# Patient Record
Sex: Male | Born: 1990 | Race: White | Hispanic: No | Marital: Single | Smoking: Current some day smoker
Health system: Southern US, Community
[De-identification: ages and names within clinical notes are randomized; demographics above are authoritative.]

## PROBLEM LIST (undated history)

## (undated) DIAGNOSIS — B019 Varicella without complication: Secondary | ICD-10-CM

## (undated) DIAGNOSIS — R51 Headache: Secondary | ICD-10-CM

## (undated) DIAGNOSIS — M8430XA Stress fracture, unspecified site, initial encounter for fracture: Secondary | ICD-10-CM

## (undated) DIAGNOSIS — K219 Gastro-esophageal reflux disease without esophagitis: Secondary | ICD-10-CM

## (undated) DIAGNOSIS — R519 Headache, unspecified: Secondary | ICD-10-CM

## (undated) DIAGNOSIS — K30 Functional dyspepsia: Secondary | ICD-10-CM

## (undated) HISTORY — DX: Headache, unspecified: R51.9

## (undated) HISTORY — DX: Stress fracture, unspecified site, initial encounter for fracture: M84.30XA

## (undated) HISTORY — DX: Headache: R51

## (undated) HISTORY — DX: Varicella without complication: B01.9

## (undated) HISTORY — DX: Functional dyspepsia: K30

## (undated) HISTORY — DX: Gastro-esophageal reflux disease without esophagitis: K21.9

---

## 2014-07-17 ENCOUNTER — Encounter: Payer: Self-pay | Admitting: Nurse Practitioner

## 2014-07-17 ENCOUNTER — Ambulatory Visit (INDEPENDENT_AMBULATORY_CARE_PROVIDER_SITE_OTHER): Payer: Self-pay | Admitting: Nurse Practitioner

## 2014-07-17 VITALS — BP 127/82 | HR 82 | Temp 97.6°F | Ht 71.0 in | Wt 211.0 lb

## 2014-07-17 DIAGNOSIS — R51 Headache: Secondary | ICD-10-CM

## 2014-07-17 DIAGNOSIS — R519 Headache, unspecified: Secondary | ICD-10-CM | POA: Insufficient documentation

## 2014-07-17 DIAGNOSIS — R5381 Other malaise: Secondary | ICD-10-CM

## 2014-07-17 DIAGNOSIS — R5383 Other fatigue: Secondary | ICD-10-CM | POA: Insufficient documentation

## 2014-07-17 LAB — BASIC METABOLIC PANEL
BUN: 12 mg/dL (ref 6–23)
CALCIUM: 9.7 mg/dL (ref 8.4–10.5)
CO2: 29 meq/L (ref 19–32)
CREATININE: 1 mg/dL (ref 0.4–1.5)
Chloride: 100 mEq/L (ref 96–112)
GFR: 101.22 mL/min (ref >60.00)
GLUCOSE: 83 mg/dL (ref 70–99)
Potassium: 5.1 mEq/L (ref 3.5–5.1)
Sodium: 136 mEq/L (ref 135–145)

## 2014-07-17 LAB — CBC
HCT: 51 % (ref 39.0–52.0)
HEMOGLOBIN: 17 g/dL (ref 13.0–17.0)
MCHC: 33.2 g/dL (ref 30.0–36.0)
MCV: 93.3 fl (ref 78.0–100.0)
Platelets: 229 10*3/uL (ref 150.0–400.0)
RBC: 5.47 Mil/uL (ref 4.22–5.81)
RDW: 13.6 % (ref 11.5–15.5)
WBC: 7.3 10*3/uL (ref 4.0–10.5)

## 2014-07-17 NOTE — Progress Notes (Signed)
Pre visit review using our clinic review tool, if applicable. No additional management support is needed unless otherwise documented below in the visit note. 

## 2014-07-17 NOTE — Patient Instructions (Signed)
I do not think you have lymphoma, but I will get some blood work.  Ears: flush daily with warm water, consider adding hydrogen peroxide to make 1:1 solution. Or use mineral oil twice weekly.  Eat a fresh fruit or vegetable every day to prevent constipation and to get substances like phytochemicals, isoflavonoids, and anti-oxidants that have been shown to decrease risk for heart disease and cancer. Eating 5 or more servings is best for your health. You are young, start now!  We will call with lab results.. Nice to meet you.

## 2014-07-17 NOTE — Progress Notes (Signed)
Subjective:     Arthur Chavez is a 23 y.o. male and is here to establish care. He has had no healthcare in 5 yrs-since military. He works Youth workermanual labor job FT. Reports was released from [employer] [facility] after 1 year due to repeated stress fractures in [place]. Currently, he has no insurance and asks to keep cost at minimum. The patient reports problems - swollen lymph  nodes in neck R >L, fatigue, night sweats, frequent HA since childhood.  Re swollen lymph nodes-he reports intermittent swollen nodes in front of ears R>L, seem to enlarge when he drinks ETOH or eats grapefruit. He is also concerned about sweating at night & feeling tired. He has had these symptoms for about 2 years. A friend told him he may have lymphoma. He denies frequent infections, but reports chronic sinus congestion. He is a smoker & works around [employer]. Typical day starts at 4:30 am, gets home from work at 6 or 7, goes to be at 10, but takes 2 hrs to fall asleep.  Re frequent HA-duration since childhood. Had w/u in past including head CT-thinks was nml. Takes 2T aleve with moderate relief. Wakes with mild HA-usually takes aleve in am-anticipating bad HA. Denies indigestion or stomach pain. Occasionally has nausea & sometimes vomits History   Social History  . Marital Status: Single    Spouse Name: N/A    Number of Children: 0  . Years of Education: N/A   Occupational History  .      building restoration   Social History Main Topics  . Smoking status: Current Some Day Smoker -- 1.00 packs/day for 7 years    Types: Cigarettes  . Smokeless tobacco: Never Used  . Alcohol Use: Yes     Comment: occasiona  . Drug Use: No  . Sexual Activity: Not on file   Other Topics Concern  . Not on file   Social History Narrative   Arthur [place] lives in [place] with his dad. 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 Spent 1 year in [employer], was sent home due to recurrent "stress fractures" in legs.    Health Maintenance  Topic Date Due  . Influenza Vaccine   07/28/2014  . Tetanus/tdap  07/18/2019    The following portions of the patient's history were reviewed and updated as appropriate: allergies, current medications, past family history, past medical history, past social history, past surgical history and problem list.  Review of Systems Pertinent items are noted in HPI.   Objective:    BP 127/82  Pulse 82  Temp(Src) 97.6 F (36.4 C) (Temporal)  Ht 5\' 11"  (1.803 m)  Wt 211 lb (95.709 kg)  BMI 29.44 kg/m2  SpO2 98% General appearance: alert, cooperative, appears stated age and no distress Head: Normocephalic, without obvious abnormality, atraumatic Eyes: negative findings: lids and lashes normal, conjunctivae and sclerae normal, corneas clear and pupils equal, round, reactive to light and accomodation Ears: copious cerumen bilat. Removed w/warm water flush. RTM opaque, retracted, pink, unable to visualize bones. L TM nml, bones visible. Throat: lips, mucosa, and tongue normal; teeth and gums normal and bilat tonsils +2, no exudate Neck: no carotid bruit, supple, symmetrical, trachea midline, thyroid not enlarged, symmetric, no tenderness/mass/nodules and able to palpate R anterior tonsillar node, moveable, NT, ovoid, sz of green pea. Lungs: clear to auscultation bilaterally Heart: regular rate and rhythm, S1, S2 normal, no murmur, click, rub or gallop Lymph nodes: Cervical, supraclavicular, and axillary nodes normal. and see neck note Neurologic: Grossly normal  Assessment:   1. Other fatigue 2 yrs, since started working manual labor job  Likely sleep deprivation. - CBC - Basic metabolic panel -TSH 2. Frequent headaches Wears glasses. No eye exam in 5 yrs. Get eye exam. HA journal  3. Pt report of swollen lymph node DD: chronic allergies, parotitis, salivary stones.  See problem list for complete A&P See pt instructions. F/u PRN.

## 2014-07-18 ENCOUNTER — Encounter (INDEPENDENT_AMBULATORY_CARE_PROVIDER_SITE_OTHER): Payer: Self-pay | Admitting: Nurse Practitioner

## 2014-07-18 ENCOUNTER — Telehealth: Payer: Self-pay

## 2014-07-18 ENCOUNTER — Telehealth: Payer: Self-pay | Admitting: Nurse Practitioner

## 2014-07-18 DIAGNOSIS — R5381 Other malaise: Secondary | ICD-10-CM

## 2014-07-18 DIAGNOSIS — R5383 Other fatigue: Principal | ICD-10-CM

## 2014-07-18 LAB — TSH: TSH: 1.11 u[IU]/mL (ref 0.35–4.50)

## 2014-07-18 NOTE — Telephone Encounter (Signed)
pls call pt: Advise Labs are nml. No f/u necessary.

## 2014-07-18 NOTE — Assessment & Plan Note (Signed)
Reports HA since childhood. Had w/u in past including head CT. HA journal to identify food, sleep, hydration triggers .

## 2014-07-18 NOTE — Telephone Encounter (Signed)
Patient notified of results.

## 2014-07-18 NOTE — Progress Notes (Signed)
This encounter was created in error - please disregard.  This encounter was created in error - please disregard.

## 2014-07-18 NOTE — Assessment & Plan Note (Signed)
2-3 yrs duration. Pt attributes to working FT, long days. Diet void of fruits & vegetables. Mostly eats meat & cheese. 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 Labs today to check for anemia & thyroid disease, WBC.

## 2014-07-18 NOTE — Telephone Encounter (Signed)
close

## 2014-07-19 ENCOUNTER — Telehealth: Payer: Self-pay | Admitting: Nurse Practitioner

## 2014-07-19 NOTE — Telephone Encounter (Signed)
.  call pls advise all labs nml. Fatigue is likely not getting enough fruits & vegetables & not enough sleep.

## 2014-07-20 NOTE — Telephone Encounter (Signed)
Patient notified of results. Patient expressed understanding.  

## 2014-08-06 ENCOUNTER — Ambulatory Visit: Payer: Self-pay | Admitting: Family Medicine

## 2015-03-12 ENCOUNTER — Ambulatory Visit (HOSPITAL_BASED_OUTPATIENT_CLINIC_OR_DEPARTMENT_OTHER)
Admission: RE | Admit: 2015-03-12 | Discharge: 2015-03-12 | Disposition: A | Discharge status: Home / Self Care | Payer: 59 | Source: Ambulatory Visit | Attending: Nurse Practitioner | Admitting: Nurse Practitioner

## 2015-03-12 ENCOUNTER — Telehealth: Payer: Self-pay | Admitting: Nurse Practitioner

## 2015-03-12 ENCOUNTER — Ambulatory Visit (INDEPENDENT_AMBULATORY_CARE_PROVIDER_SITE_OTHER): Payer: 59 | Admitting: Nurse Practitioner

## 2015-03-12 ENCOUNTER — Encounter: Payer: Self-pay | Admitting: Nurse Practitioner

## 2015-03-12 VITALS — BP 121/86 | HR 72 | Temp 98.4°F | Ht 71.0 in | Wt 224.0 lb

## 2015-03-12 DIAGNOSIS — M79644 Pain in right finger(s): Secondary | ICD-10-CM | POA: Diagnosis present

## 2015-03-12 DIAGNOSIS — S6991XA Unspecified injury of right wrist, hand and finger(s), initial encounter: Secondary | ICD-10-CM

## 2015-03-12 DIAGNOSIS — X58XXXA Exposure to other specified factors, initial encounter: Secondary | ICD-10-CM | POA: Insufficient documentation

## 2015-03-12 NOTE — Patient Instructions (Signed)
Continue to use finger splint until I call with XRAY results.  I amy need to refer to orthopedist if you have fracture.  Schedule physical at your convenience.

## 2015-03-12 NOTE — Progress Notes (Signed)
   Subjective:    Patient ID: Arthur Chavez, male    DOB: 12-28-1991, 24 y.o.   MRN: [id number]  HPI Comments: 2 separate injuries: 1- "smashed" hand between dryer & door knob 3 weeks ago 2-hand was caught inside couch while moving it-couch was dropped, middle finger twisted Last OV: pt c/o fatigue-he is sleeping better & feels much better-no day-time sleepiness.  He is drinking 2 red bulls daily. Doesn't drink coffee. Continues to have HA most days. Takes 2t aleve twice day w/relief.   Hand Injury  The incident occurred more than 1 week ago (2 1/2 weeks ago). The incident occurred at work. The injury mechanism was twisted and a direct blow. The pain is present in the right hand. The quality of the pain is described as aching. The pain radiates to the right arm and right hand. The pain is moderate. The pain has been worsening since the incident. Pertinent negatives include no muscle weakness, numbness or tingling. The symptoms are aggravated by movement, lifting and palpation. He has tried NSAIDs, rest and immobilization (pt made metal splint for finger) for the symptoms. The treatment provided no relief.      Review of Systems  Constitutional: Negative for fever and fatigue.  Eyes: Negative for visual disturbance.       Wears glasses. No eye exam in 5 yrs.  Neurological: Positive for headaches. Negative for dizziness, tingling and numbness.       Objective:   Physical Exam  Constitutional: He is oriented to person, place, and time. He appears well-developed and well-nourished. No distress.  HENT:  Head: Normocephalic and atraumatic.  wearing glasses  Eyes: Conjunctivae are normal. Right eye exhibits no discharge. Left eye exhibits no discharge.  Wearing glasses   Cardiovascular: Normal rate, regular rhythm and normal heart sounds.   Pulmonary/Chest: Effort normal and breath sounds normal.  Musculoskeletal: He exhibits edema and tenderness.  Not able to make complete fist. Not able  to fully flex fingers. Pain at middle finger-PIP joint & along 3rd metacarpal. FROM in wrist. Fingers warm, radial pulse +2. Brisk cap refill fingers. Mild swelling of 3rd finger.  Neurological: He is alert and oriented to person, place, and time.  Skin: Skin is warm and dry. No rash noted. No erythema.  Psychiatric: He has a normal mood and affect. His behavior is normal. Thought content normal.  Vitals reviewed.         Assessment & Plan:  1. Hand injuries, right, initial encounter - DG Hand Complete Right; Future R wrist support splint fitted & applied.  Continue to wear finger splint until i call w/xray results.  Return at convenience for physical.

## 2015-03-12 NOTE — Progress Notes (Signed)
Pre visit review using our clinic review tool, if applicable. No additional management support is needed unless otherwise documented below in the visit note. 

## 2015-03-12 NOTE — Telephone Encounter (Signed)
pls call pt: Advise There is no fracture in hand. He likely has soft tissue injury which includes tendons & ligaments He still may see hand specialist  If not continue to wear splint for support until pain improves.  Pain may last 8 weeks, it should get gradually better, not worse.

## 2015-03-13 NOTE — Telephone Encounter (Signed)
Called and informed patient of results. Patient wants to see if his hand will get better before seeing specialist. I told him to keep wearing his splint for support. Patient agrees!

## 2016-02-21 IMAGING — DX DG HAND COMPLETE 3+V*R*
3 series · 3 of 3 positions shown · non-contrast
Comparison: None.

CLINICAL DATA: Third digit pain after repeated trauma over 3 weeks.
Initial encounter.

EXAM:
RIGHT HAND - COMPLETE 3+ VIEW

[hand pa]
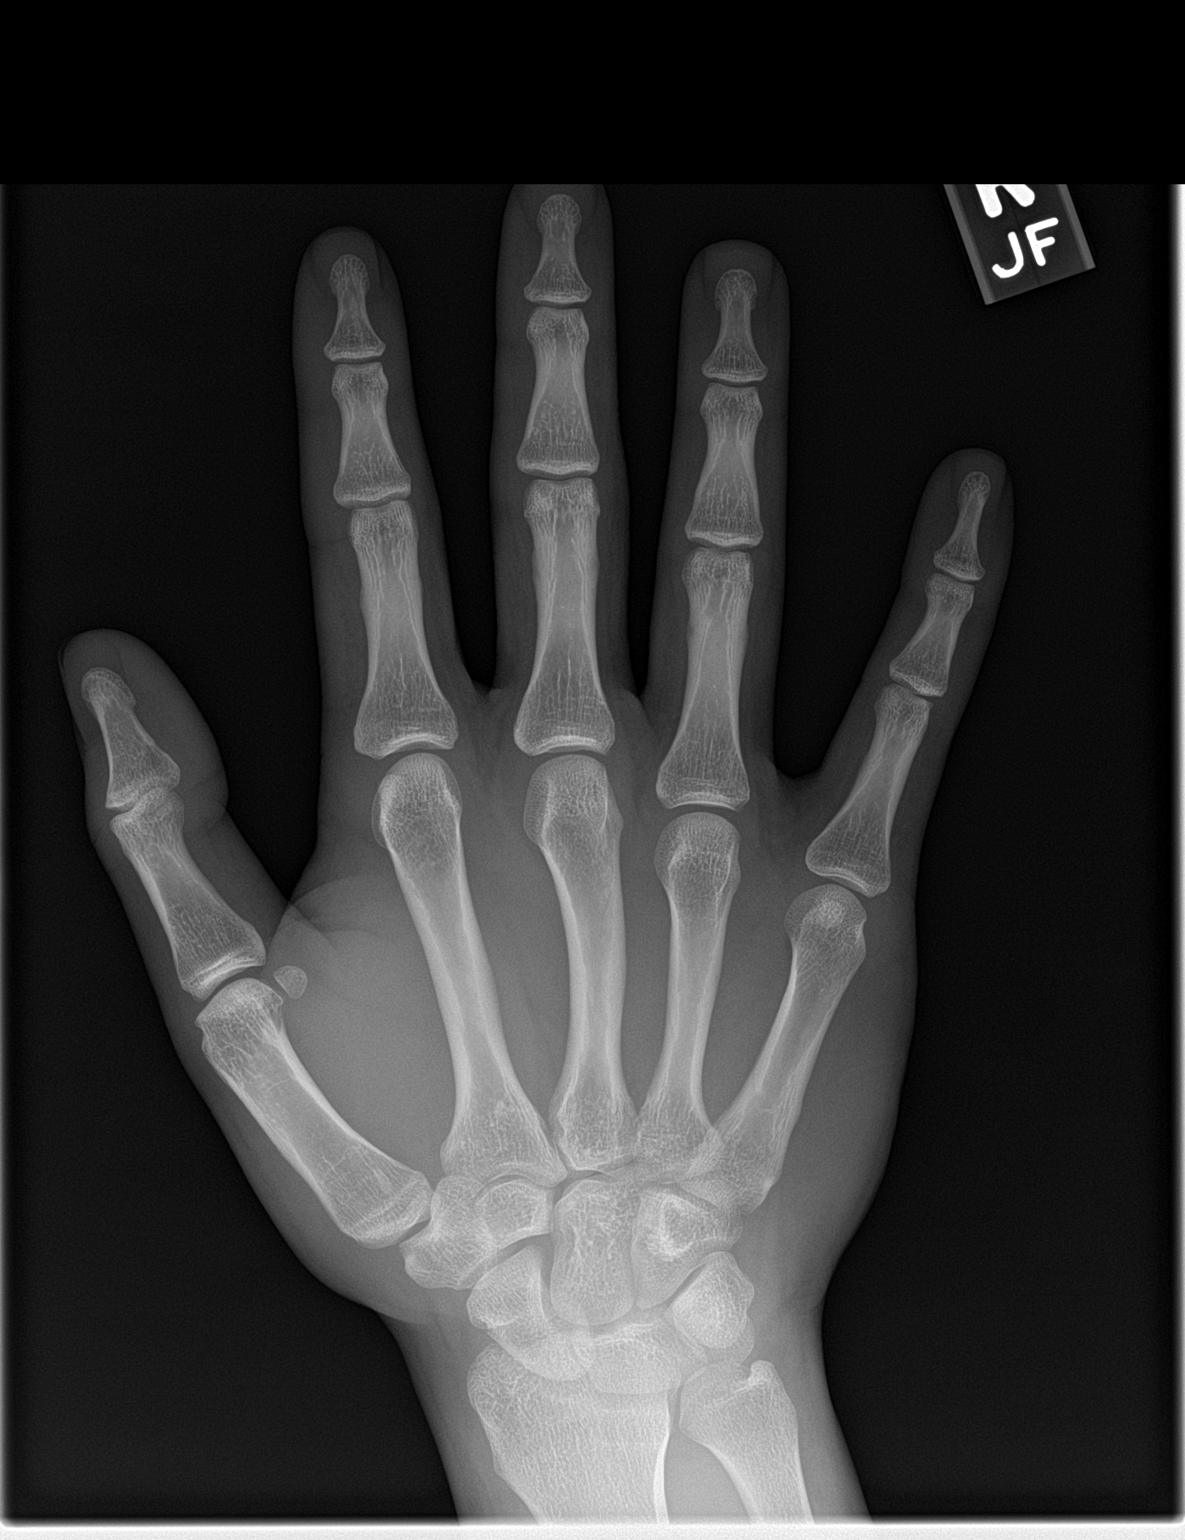

[hand obl]
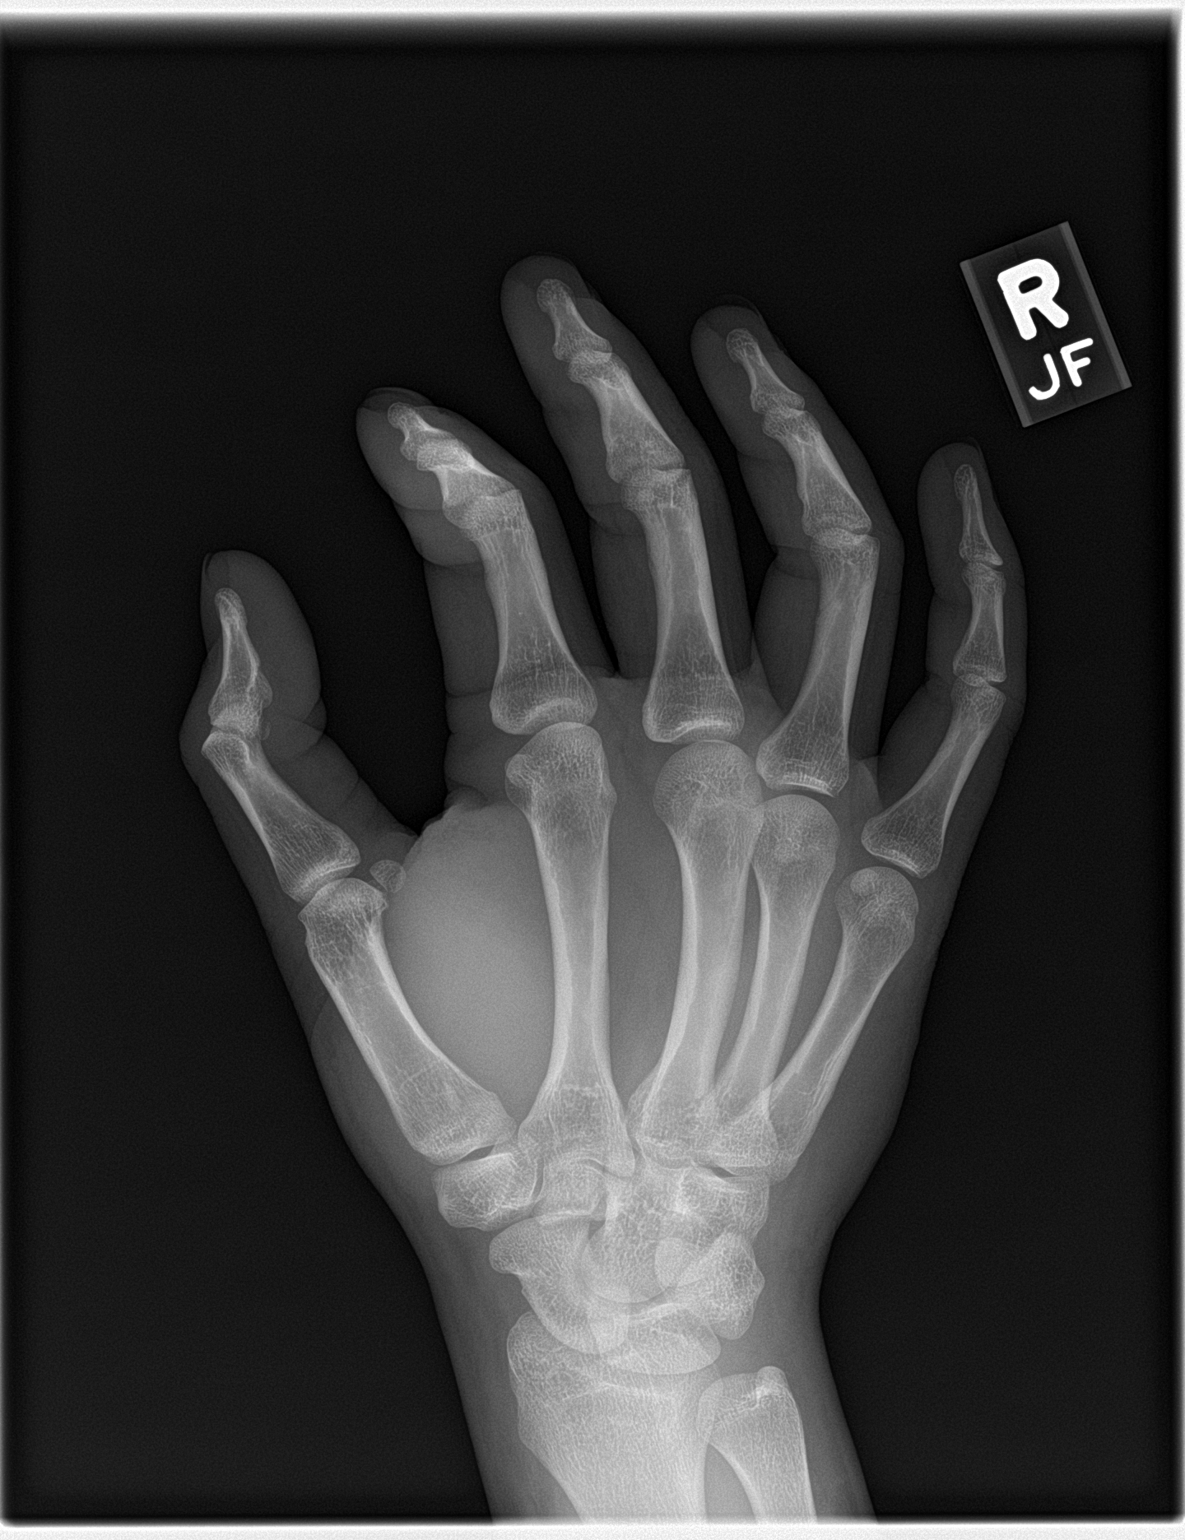

[hand lat]
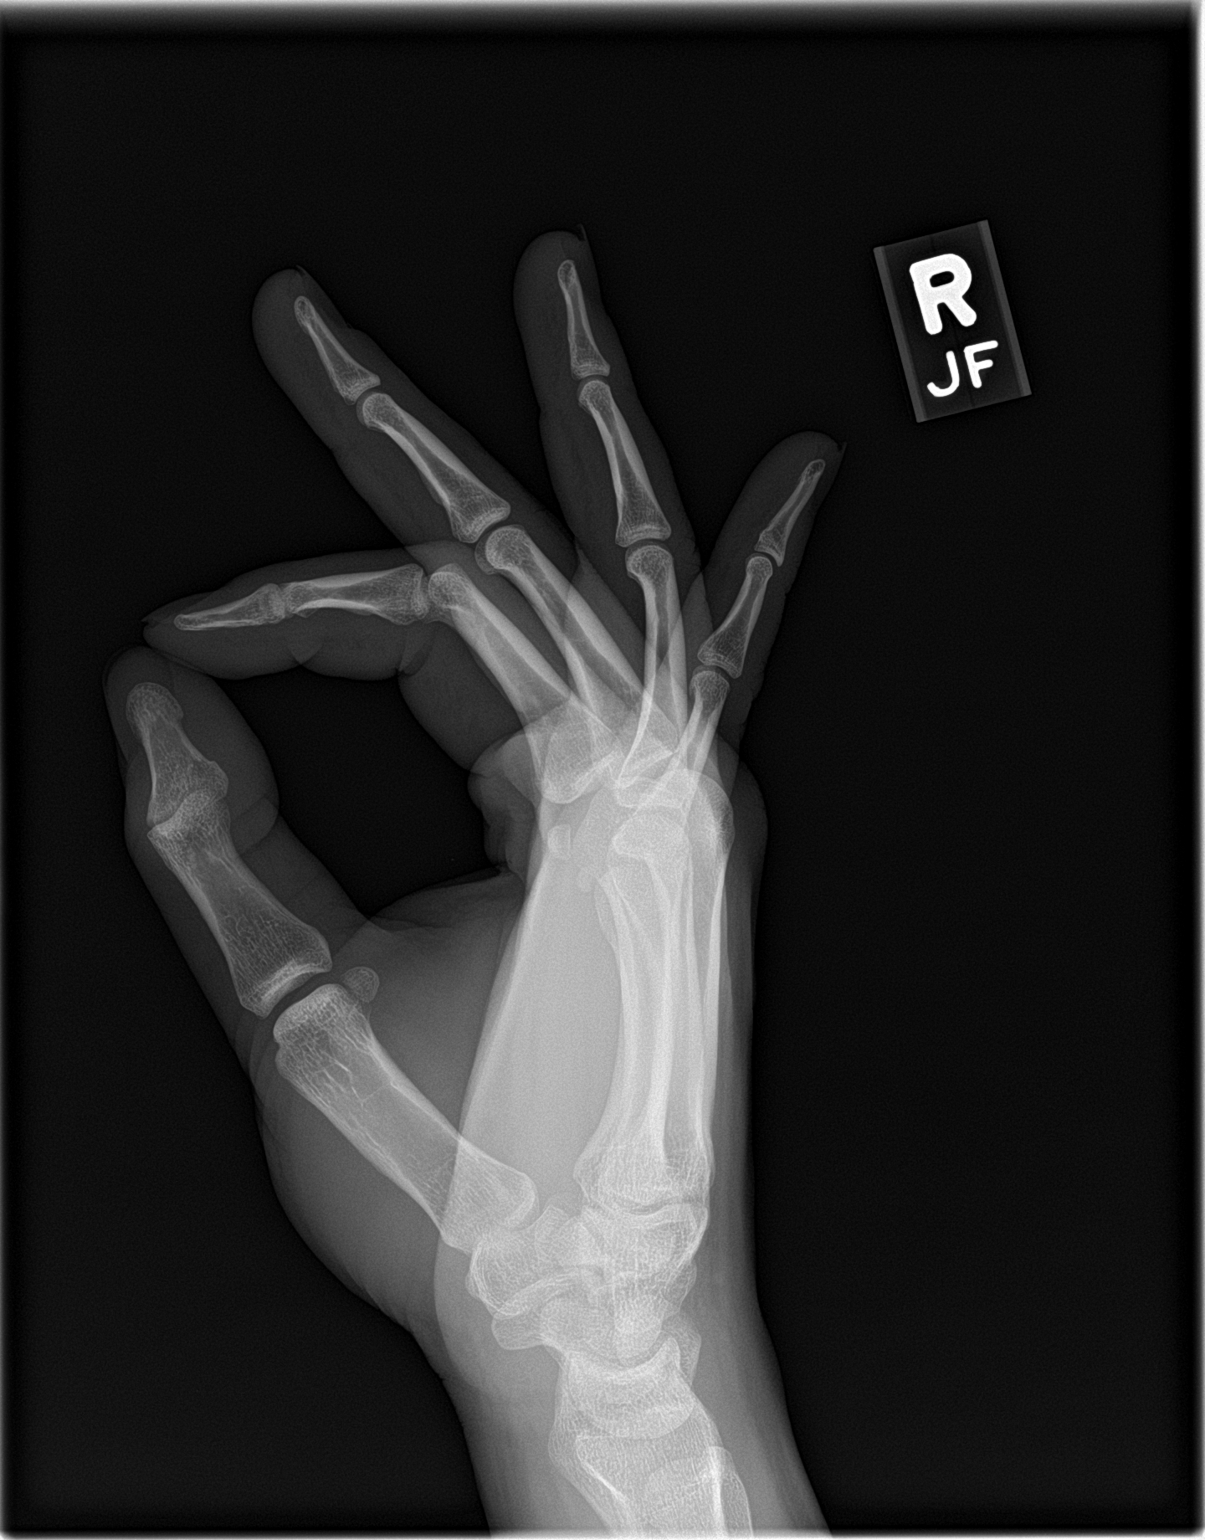

[3 of 3 positions shown; findings below may reference images not displayed]

FINDINGS: There is no evidence of fracture or dislocation. There is no
evidence of arthropathy or other focal bone abnormality. Soft
tissues are unremarkable.
IMPRESSION: Negative.

## 2016-10-27 ENCOUNTER — Ambulatory Visit (INDEPENDENT_AMBULATORY_CARE_PROVIDER_SITE_OTHER): Payer: Self-pay | Admitting: Family Medicine

## 2016-10-27 VITALS — BP 124/80 | HR 88 | Temp 98.5°F | Resp 17 | Ht 72.5 in | Wt 232.0 lb

## 2016-10-27 DIAGNOSIS — Z024 Encounter for examination for driving license: Secondary | ICD-10-CM

## 2016-10-27 NOTE — Patient Instructions (Signed)
     IF you received an x-ray today, you will receive an invoice from [facility]. Please contact [facility] at [phone] with questions or concerns regarding your invoice.   IF you received labwork today, you will receive an invoice from [facility]. Please contact [facility] at [phone] with questions or concerns regarding your invoice.   Our billing staff will not be able to assist you with questions regarding bills from these companies.  You will be contacted with the lab results as soon as they are available. The fastest way to get your results is to activate your My Chart account. Instructions are located on the last page of this paperwork. If you have not heard from us regarding the results in 2 weeks, please contact this office.      

## 2016-10-27 NOTE — Progress Notes (Signed)
Commercial Driver Medical Examination   Arthur Chavez is a 25 y.o. male who presents today for a commercial driver fitness determination physical exam. The patient reports no problems. The following portions of the patient's history were reviewed and updated as appropriate: allergies, current medications, past family history, past medical history, past social history, past surgical history and problem list. Review of Systems A comprehensive review of systems was negative.   Objective:    Vision:  Visual Acuity Screening   Right eye Left eye Both eyes  Without correction:     With correction: 20/20 20/30 20/20   Comments: Peripheral Vision: Right eye 85  degrees. Left eye 85 degrees.     Applicant can recognize and distinguish among traffic control signals and devices showing standard red, green, and amber colors.  Applicant meets visual acuity requirement only when wearing corrective lenses.  Monocular Vision?: No   Hearing:  Hearing Screening Comments:  The patient was able to hear a forced whisper from 10 feet.  BP 124/80 (BP Location: Right Arm, Patient Position: Sitting, Cuff Size: Normal)   Pulse 88   Temp 98.5 F (36.9 C) (Oral)   Resp 17   Ht 6' 0.5" (1.842 m)   Wt 232 lb (105.2 kg)   SpO2 98%   BMI 31.03 kg/m   General Appearance:    Alert, cooperative, no distress, appears stated age  Head:    Normocephalic, without obvious abnormality, atraumatic  Eyes:    PERRL, conjunctiva/corneas clear, EOM's intact, fundi    benign, both eyes       Ears:    Normal TM's and external ear canals, both ears  Nose:   Nares normal, septum midline, mucosa normal, no drainage    or sinus tenderness  Throat:   Lips, mucosa, and tongue normal; teeth and gums normal  Neck:   Supple, symmetrical, trachea midline, no adenopathy;       thyroid:  No enlargement/tenderness/nodules; no carotid   bruit or JVD  Back:     Symmetric, no curvature, ROM normal, no CVA tenderness  Lungs:      Clear to auscultation bilaterally, respirations unlabored  Chest wall:    No tenderness or deformity  Heart:    Regular rate and rhythm, S1 and S2 normal, no murmur, rub   or gallop  Abdomen:     Soft, non-tender, bowel sounds active all four quadrants,    no masses, no organomegaly        Extremities:   Extremities normal, atraumatic, no cyanosis or edema  Pulses:   2+ and symmetric all extremities  Skin:   Skin color, texture, turgor normal, no rashes or lesions  Lymph nodes:   Cervical, supraclavicular, and axillary nodes normal  Neurologic:   CNII-XII intact. Normal strength, sensation and reflexes      throughout     Labs: SG 1.010, neg blood, neg sugar, trace protein    Assessment:    Healthy male exam.  Meets standards in 8149 CFR 391.41;  qualifies for 2 year certificate.    Plan:    Medical examiners certificate completed and printed. Return as needed.
